# Patient Record
Sex: Male | Born: 1955 | Race: White | Hispanic: No | Marital: Married | Smoking: Current every day smoker
Health system: Southern US, Community
[De-identification: ages and names within clinical notes are randomized; demographics above are authoritative.]

## PROBLEM LIST (undated history)

## (undated) DIAGNOSIS — M549 Dorsalgia, unspecified: Secondary | ICD-10-CM

## (undated) DIAGNOSIS — G8929 Other chronic pain: Secondary | ICD-10-CM

## (undated) DIAGNOSIS — G894 Chronic pain syndrome: Secondary | ICD-10-CM

## (undated) DIAGNOSIS — B1920 Unspecified viral hepatitis C without hepatic coma: Secondary | ICD-10-CM

## (undated) HISTORY — DX: Other chronic pain: G89.29

## (undated) HISTORY — PX: OTHER SURGICAL HISTORY: SHX169

## (undated) HISTORY — DX: Unspecified viral hepatitis C without hepatic coma: B19.20

## (undated) HISTORY — PX: INGUINAL HERNIA REPAIR: SUR1180

## (undated) HISTORY — DX: Chronic pain syndrome: G89.4

## (undated) HISTORY — DX: Dorsalgia, unspecified: M54.9

---

## 2004-05-20 ENCOUNTER — Encounter: Admission: RE | Admit: 2004-05-20 | Discharge: 2004-05-20 | Payer: Self-pay | Admitting: Surgery

## 2004-05-21 ENCOUNTER — Ambulatory Visit (HOSPITAL_COMMUNITY): Admission: RE | Admit: 2004-05-21 | Discharge: 2004-05-21 | Payer: Self-pay | Admitting: Surgery

## 2004-05-21 ENCOUNTER — Ambulatory Visit (HOSPITAL_BASED_OUTPATIENT_CLINIC_OR_DEPARTMENT_OTHER): Admission: RE | Admit: 2004-05-21 | Discharge: 2004-05-21 | Payer: Self-pay | Admitting: Surgery

## 2012-01-22 ENCOUNTER — Emergency Department (HOSPITAL_COMMUNITY)
Admission: EM | Admit: 2012-01-22 | Discharge: 2012-01-23 | Disposition: A | Discharge status: Home / Self Care | Payer: BC Managed Care – PPO | Attending: Emergency Medicine | Admitting: Emergency Medicine

## 2012-01-22 ENCOUNTER — Encounter (HOSPITAL_COMMUNITY): Payer: Self-pay | Admitting: *Deleted

## 2012-01-22 DIAGNOSIS — IMO0002 Reserved for concepts with insufficient information to code with codable children: Secondary | ICD-10-CM | POA: Insufficient documentation

## 2012-01-22 DIAGNOSIS — S01511A Laceration without foreign body of lip, initial encounter: Secondary | ICD-10-CM

## 2012-01-22 DIAGNOSIS — R51 Headache: Secondary | ICD-10-CM | POA: Insufficient documentation

## 2012-01-22 DIAGNOSIS — S01501A Unspecified open wound of lip, initial encounter: Secondary | ICD-10-CM | POA: Insufficient documentation

## 2012-01-22 DIAGNOSIS — S0993XA Unspecified injury of face, initial encounter: Secondary | ICD-10-CM | POA: Insufficient documentation

## 2012-01-22 DIAGNOSIS — S025XXA Fracture of tooth (traumatic), initial encounter for closed fracture: Secondary | ICD-10-CM | POA: Insufficient documentation

## 2012-01-22 MED ORDER — OXYCODONE-ACETAMINOPHEN 5-325 MG PO TABS: 1.0000 | ORAL_TABLET | Freq: Four times a day (QID) | ORAL | Status: DC | PRN

## 2012-01-22 MED ORDER — OXYCODONE-ACETAMINOPHEN 5-325 MG PO TABS: 1.0000 | ORAL_TABLET | Freq: Four times a day (QID) | ORAL | Status: AC | PRN

## 2012-01-22 MED ORDER — HYDROMORPHONE HCL PF 1 MG/ML IJ SOLN
1.0000 mg | Freq: Once | INTRAMUSCULAR | Status: AC
  Administered 2012-01-22: 1 mg via INTRAMUSCULAR
  Filled 2012-01-22: qty 1

## 2012-01-22 MED ORDER — LIDOCAINE-EPINEPHRINE (PF) 1 %-1:200000 IJ SOLN
INTRAMUSCULAR | Status: AC
  Administered 2012-01-22: 22:00:00
  Filled 2012-01-22: qty 10

## 2012-01-22 MED ORDER — TETANUS-DIPHTH-ACELL PERTUSSIS 5-2.5-18.5 LF-MCG/0.5 IM SUSP
0.5000 mL | Freq: Once | INTRAMUSCULAR | Status: AC
  Administered 2012-01-22: 0.5 mL via INTRAMUSCULAR
  Filled 2012-01-22 (×2): qty 0.5

## 2012-01-22 MED ORDER — HYDROMORPHONE HCL PF 1 MG/ML IJ SOLN: 1.0000 mg | Freq: Once | INTRAMUSCULAR | Status: DC
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
## 2012-01-22 NOTE — ED Notes (Signed)
Left broken teeth at home

## 2012-01-22 NOTE — Discharge Instructions (Signed)
Follow up with a dentist Monday.  Your sutures should dissolve
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
## 2012-01-23 NOTE — ED Notes (Signed)
6 pack of Percocet dispensed as written by Dr Estell Harpin

## 2016-11-04 ENCOUNTER — Encounter (INDEPENDENT_AMBULATORY_CARE_PROVIDER_SITE_OTHER): Payer: Self-pay | Admitting: Internal Medicine

## 2016-11-04 ENCOUNTER — Encounter (INDEPENDENT_AMBULATORY_CARE_PROVIDER_SITE_OTHER): Payer: Self-pay

## 2016-11-25 ENCOUNTER — Encounter (INDEPENDENT_AMBULATORY_CARE_PROVIDER_SITE_OTHER): Payer: Self-pay | Admitting: Internal Medicine

## 2016-11-25 ENCOUNTER — Ambulatory Visit (INDEPENDENT_AMBULATORY_CARE_PROVIDER_SITE_OTHER): Payer: BC Managed Care – PPO | Admitting: Internal Medicine

## 2016-11-25 VITALS — BP 168/90 | HR 60 | Temp 97.6°F | Ht 72.0 in | Wt 233.6 lb

## 2016-11-25 DIAGNOSIS — G894 Chronic pain syndrome: Secondary | ICD-10-CM

## 2016-11-25 DIAGNOSIS — B182 Chronic viral hepatitis C: Secondary | ICD-10-CM

## 2016-11-25 DIAGNOSIS — M549 Dorsalgia, unspecified: Secondary | ICD-10-CM

## 2016-11-25 DIAGNOSIS — G8929 Other chronic pain: Secondary | ICD-10-CM

## 2016-11-25 DIAGNOSIS — B1920 Unspecified viral hepatitis C without hepatic coma: Secondary | ICD-10-CM

## 2016-11-25 HISTORY — DX: Chronic pain syndrome: G89.4

## 2016-11-25 HISTORY — DX: Other chronic pain: G89.29

## 2016-11-25 HISTORY — DX: Unspecified viral hepatitis C without hepatic coma: B19.20

## 2016-11-25 LAB — CBC WITH DIFFERENTIAL/PLATELET
BASOS PCT: 0 %
Basophils Absolute: 0 cells/uL (ref 0–200)
EOS PCT: 3 %
Eosinophils Absolute: 225 cells/uL (ref 15–500)
HEMATOCRIT: 48.8 % (ref 38.5–50.0)
HEMOGLOBIN: 16.9 g/dL (ref 13.2–17.1)
LYMPHS ABS: 1950 {cells}/uL (ref 850–3900)
Lymphocytes Relative: 26 %
MCH: 30.7 pg (ref 27.0–33.0)
MCHC: 34.6 g/dL (ref 32.0–36.0)
MCV: 88.7 fL (ref 80.0–100.0)
MONO ABS: 900 {cells}/uL (ref 200–950)
MPV: 11.8 fL (ref 7.5–12.5)
Monocytes Relative: 12 %
NEUTROS ABS: 4425 {cells}/uL (ref 1500–7800)
Neutrophils Relative %: 59 %
Platelets: 295 10*3/uL (ref 140–400)
RBC: 5.5 MIL/uL (ref 4.20–5.80)
RDW: 13.3 % (ref 11.0–15.0)
WBC: 7.5 10*3/uL (ref 3.8–10.8)

## 2016-11-25 LAB — HEPATIC FUNCTION PANEL
ALBUMIN: 4.4 g/dL (ref 3.6–5.1)
ALT: 49 U/L — AB (ref 9–46)
AST: 29 U/L (ref 10–35)
Alkaline Phosphatase: 59 U/L (ref 40–115)
BILIRUBIN TOTAL: 0.7 mg/dL (ref 0.2–1.2)
Bilirubin, Direct: 0.1 mg/dL (ref <=0.2)
Indirect Bilirubin: 0.6 mg/dL (ref 0.2–1.2)
TOTAL PROTEIN: 7.9 g/dL (ref 6.1–8.1)

## 2016-11-25 NOTE — Progress Notes (Signed)
   Subjective:    Patient ID: Joe Caldwell, male    DOB: [birth year], 61 y.o.   MRN: [id number]  HPI Referred by Dr. Olena LeatherwoodHasanaj for Hepatitis C.  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 Has not done IV drugs since the 70s-early 80s. No tattoos. No blood transfusion. No etoh. Has not drank etoh 3-4 yrs. Appetite is good. No weight loss.  No abdominal pain. He has a BM daily. No melena or BRRB. Denies suicidal idealation    08/27/2016 He C antibody positive.  Review of Systems Past Medical History:  Diagnosis Date  . Chronic back pain 11/25/2016  . Chronic pain syndrome 11/25/2016  . Hepatitis C 11/25/2016    No past surgical history on file.  No Known Allergies  No current outpatient prescriptions on file prior to visit.   No current facility-administered medications on file prior to visit.    Current Outpatient Prescriptions  Medication Sig Dispense Refill  . cetirizine (ZYRTEC) 10 MG tablet Take 10 mg by mouth daily.    . cyclobenzaprine (FLEXERIL) 10 MG tablet Take 10 mg by mouth 3 (three) times daily.    . diazepam (VALIUM) 10 MG tablet Take 10 mg by mouth every 6 (six) hours as needed for anxiety.    . Multiple Vitamin (MULTIVITAMIN) tablet Take 1 tablet by mouth daily.    . Omega-3 Fatty Acids (FISH OIL) 1200 MG CPDR Take by mouth 2 (two) times daily.    Marland Kitchen. omeprazole (PRILOSEC) 20 MG capsule Take 20 mg by mouth daily.    . traMADol (ULTRAM) 50 MG tablet Take by mouth every 6 (six) hours as needed.     No current facility-administered medications for this visit.        Objective:   Physical Exam Blood pressure (!) 168/90, pulse 60, temperature 97.6 F (36.4 C), height 6' (1.829 m), weight 233 lb 9.6 oz (106 kg).  Alert and oriented. Skin warm and dry. Oral mucosa is moist.   . Sclera anicteric, conjunctivae is pink. Thyroid not enlarged. No cervical lymphadenopathy. Lungs clear. Heart regular rate and rhythm.  Abdomen is soft. Bowel sounds are positive. No hepatomegaly. No  abdominal masses felt. No tenderness.  No edema to lower extremities.         Assessment & Plan:  Hepatitis C. Hepatic function, CBC, PT/INR, Hep C antibody, Hep C quaint, Hep C genotype, urine drug screen.  Once labs are back: US elastrography Patient signed treatment agreement OV in 3 months.

## 2016-11-26 ENCOUNTER — Encounter (INDEPENDENT_AMBULATORY_CARE_PROVIDER_SITE_OTHER): Payer: Self-pay

## 2016-11-26 LAB — HEPATITIS PANEL, ACUTE
HCV Ab: REACTIVE — AB
HEP A IGM: NONREACTIVE
Hep B C IgM: NONREACTIVE
Hepatitis B Surface Ag: NEGATIVE

## 2016-11-26 LAB — PROTIME-INR
INR: 1
PROTHROMBIN TIME: 10.4 s (ref 9.0–11.5)

## 2016-11-26 LAB — AFP TUMOR MARKER: AFP TUMOR MARKER: 2.4 ng/mL (ref <6.1)

## 2016-11-28 LAB — DRUG ABUSE 10-50+ETHANOL, U
ALCOHOL, ETHYL (U): NEGATIVE
AMPHETAMINES (1000 ng/mL SCRN): NEGATIVE
BARBITURATES: NEGATIVE
BENZODIAZEPINES: POSITIVE — AB
COCAINE METABOLITES: NEGATIVE
MARIJUANA MET (50 NG/ML SCRN): NEGATIVE
METHADONE: NEGATIVE
METHAQUALONE: NEGATIVE
OPIATES: NEGATIVE
PHENCYCLIDINE: NEGATIVE
PROPOXYPHENE: NEGATIVE

## 2016-11-30 LAB — PAIN MGMT, PROFILE 1 W/O CONF, U
Amphetamines: NEGATIVE ng/mL (ref <500)
BARBITURATES: NEGATIVE ng/mL (ref <300)
Benzodiazepines: POSITIVE ng/mL — AB (ref <100)
COCAINE METABOLITE: NEGATIVE ng/mL (ref <150)
Creatinine: 55.4 mg/dL (ref >=20.0)
MARIJUANA METABOLITE: NEGATIVE ng/mL (ref <20)
METHADONE METABOLITE: NEGATIVE ng/mL (ref <100)
OPIATES: NEGATIVE ng/mL (ref <100)
OXYCODONE: NEGATIVE ng/mL (ref <100)
Oxidant: NEGATIVE ug/mL (ref <200)
PHENCYCLIDINE: NEGATIVE ng/mL (ref <25)
pH: 5.14 (ref 4.5–9.0)

## 2016-12-01 ENCOUNTER — Telehealth (INDEPENDENT_AMBULATORY_CARE_PROVIDER_SITE_OTHER): Payer: Self-pay | Admitting: *Deleted

## 2016-12-01 NOTE — Telephone Encounter (Signed)
Cohen from [facility] returned a call regarding the Hepatitis C testing results. He says that there may be an additional 5 days before these test result. His call back number is [phone].

## 2016-12-03 LAB — HEPATITIS C GENOTYPE

## 2016-12-06 ENCOUNTER — Telehealth (INDEPENDENT_AMBULATORY_CARE_PROVIDER_SITE_OTHER): Payer: Self-pay | Admitting: Internal Medicine

## 2016-12-06 DIAGNOSIS — B1920 Unspecified viral hepatitis C without hepatic coma: Secondary | ICD-10-CM

## 2016-12-06 LAB — HEPATITIS C RNA QUANTITATIVE
HCV Quantitative Log: 7.11 {Log} — ABNORMAL HIGH (ref <1.18)
HCV Quantitative: 12779614 IU/mL — ABNORMAL HIGH (ref <15)

## 2016-12-06 NOTE — Telephone Encounter (Signed)
Joe Caldwell, US abdomen/elast. I spoke with patient today

## 2016-12-06 NOTE — Telephone Encounter (Signed)
US sch'd 12/09/16 at 800 am (75), npo after midnight, patient aware

## 2016-12-09 ENCOUNTER — Ambulatory Visit (HOSPITAL_COMMUNITY): Admission: RE | Admit: 2016-12-09 | Payer: BC Managed Care – PPO | Source: Ambulatory Visit

## 2016-12-14 ENCOUNTER — Ambulatory Visit (HOSPITAL_COMMUNITY)
Admission: RE | Admit: 2016-12-14 | Discharge: 2016-12-14 | Disposition: A | Discharge status: Home / Self Care | Payer: BC Managed Care – PPO | Source: Ambulatory Visit | Attending: Internal Medicine | Admitting: Internal Medicine

## 2016-12-14 DIAGNOSIS — B1920 Unspecified viral hepatitis C without hepatic coma: Secondary | ICD-10-CM | POA: Diagnosis present

## 2016-12-14 DIAGNOSIS — R935 Abnormal findings on diagnostic imaging of other abdominal regions, including retroperitoneum: Secondary | ICD-10-CM | POA: Diagnosis not present

## 2016-12-14 DIAGNOSIS — R140 Abdominal distension (gaseous): Secondary | ICD-10-CM | POA: Diagnosis not present

## 2016-12-14 DIAGNOSIS — R932 Abnormal findings on diagnostic imaging of liver and biliary tract: Secondary | ICD-10-CM | POA: Insufficient documentation

## 2016-12-17 ENCOUNTER — Telehealth (INDEPENDENT_AMBULATORY_CARE_PROVIDER_SITE_OTHER): Payer: Self-pay | Admitting: Internal Medicine

## 2016-12-17 NOTE — Telephone Encounter (Signed)
Will treat with Harvoni x 12 weeks.  US elastrography: Cirrhosis is not present.

## 2016-12-20 NOTE — Telephone Encounter (Signed)
This will be worked on 12/22/2016.

## 2016-12-21 NOTE — Telephone Encounter (Signed)
We have prepared the paperwork to be sent to [employer] for assistance with PA. Patient will be notified as we are made aware of the status.

## 2016-12-28 ENCOUNTER — Telehealth (INDEPENDENT_AMBULATORY_CARE_PROVIDER_SITE_OTHER): Payer: Self-pay | Admitting: Internal Medicine

## 2016-12-28 NOTE — Telephone Encounter (Signed)
Patient called, stated that he got a letter about the Harvoni.  Stated that the [employer] company is saying that whoever called from our office hung up on them and they need to speak to our office to get approval done.  [phone]

## 2016-12-29 NOTE — Telephone Encounter (Signed)
Patient was called and made aware that [facility] will be handling his medication. Once it has been rec'd to our office , we will call him to come and pick it up.

## 2016-12-29 NOTE — Telephone Encounter (Signed)
This medication has been approved, [facility] will need to fill this Rx due to the patient's insurance. This has been verified with [facility]. They will contact the patient . Medication will be shipped to our office.

## 2017-01-03 ENCOUNTER — Telehealth (INDEPENDENT_AMBULATORY_CARE_PROVIDER_SITE_OTHER): Payer: Self-pay | Admitting: Internal Medicine

## 2017-01-03 ENCOUNTER — Other Ambulatory Visit (INDEPENDENT_AMBULATORY_CARE_PROVIDER_SITE_OTHER): Payer: Self-pay | Admitting: *Deleted

## 2017-01-03 DIAGNOSIS — B182 Chronic viral hepatitis C: Secondary | ICD-10-CM

## 2017-01-03 NOTE — Telephone Encounter (Signed)
Tammy, Hepatic, CBC and Hep C quaint in 8 weeks.  Hope, OV in 8 weeks

## 2017-01-03 NOTE — Telephone Encounter (Signed)
CBC, Hepatic, Hep C quaint in 4 weeks. sorry

## 2017-01-03 NOTE — Telephone Encounter (Signed)
Labs are noted for 4 weeks , and a letter will be sent as a reminder.   

## 2017-01-03 NOTE — Telephone Encounter (Signed)
Labs are noted for 4 weeks , and a letter will be sent as a reminder.

## 2017-01-06 NOTE — Telephone Encounter (Signed)
error 

## 2017-01-11 ENCOUNTER — Encounter (INDEPENDENT_AMBULATORY_CARE_PROVIDER_SITE_OTHER): Payer: Self-pay | Admitting: Internal Medicine

## 2017-01-11 NOTE — Telephone Encounter (Signed)
Patient was given an appointment for 02/28/17 at 10:45am.  I called the patient and a letter was mailed.

## 2017-01-17 ENCOUNTER — Other Ambulatory Visit (INDEPENDENT_AMBULATORY_CARE_PROVIDER_SITE_OTHER): Payer: Self-pay | Admitting: *Deleted

## 2017-01-17 ENCOUNTER — Encounter (INDEPENDENT_AMBULATORY_CARE_PROVIDER_SITE_OTHER): Payer: Self-pay | Admitting: *Deleted

## 2017-01-17 DIAGNOSIS — B182 Chronic viral hepatitis C: Secondary | ICD-10-CM

## 2017-01-27 LAB — HEPATIC FUNCTION PANEL
ALBUMIN: 4.6 g/dL (ref 3.6–5.1)
ALK PHOS: 66 U/L (ref 40–115)
ALT: 36 U/L (ref 9–46)
AST: 22 U/L (ref 10–35)
BILIRUBIN INDIRECT: 0.5 mg/dL (ref 0.2–1.2)
BILIRUBIN TOTAL: 0.6 mg/dL (ref 0.2–1.2)
Bilirubin, Direct: 0.1 mg/dL (ref <=0.2)
Total Protein: 8.2 g/dL — ABNORMAL HIGH (ref 6.1–8.1)

## 2017-01-27 LAB — CBC
HEMATOCRIT: 49.4 % (ref 38.5–50.0)
HEMOGLOBIN: 17 g/dL (ref 13.2–17.1)
MCH: 31.1 pg (ref 27.0–33.0)
MCHC: 34.4 g/dL (ref 32.0–36.0)
MCV: 90.5 fL (ref 80.0–100.0)
MPV: 11.8 fL (ref 7.5–12.5)
Platelets: 296 10*3/uL (ref 140–400)
RBC: 5.46 MIL/uL (ref 4.20–5.80)
RDW: 13.3 % (ref 11.0–15.0)
WBC: 7.7 10*3/uL (ref 3.8–10.8)

## 2017-01-29 LAB — HEPATITIS C RNA QUANTITATIVE
HCV QUANT LOG: DETECTED {Log_IU}/mL — AB
HCV QUANT: DETECTED [IU]/mL — AB

## 2017-01-31 ENCOUNTER — Other Ambulatory Visit (INDEPENDENT_AMBULATORY_CARE_PROVIDER_SITE_OTHER): Payer: Self-pay | Admitting: *Deleted

## 2017-01-31 DIAGNOSIS — B182 Chronic viral hepatitis C: Secondary | ICD-10-CM

## 2017-02-14 ENCOUNTER — Encounter (INDEPENDENT_AMBULATORY_CARE_PROVIDER_SITE_OTHER): Payer: Self-pay | Admitting: *Deleted

## 2017-02-14 ENCOUNTER — Other Ambulatory Visit (INDEPENDENT_AMBULATORY_CARE_PROVIDER_SITE_OTHER): Payer: Self-pay | Admitting: *Deleted

## 2017-02-14 DIAGNOSIS — B182 Chronic viral hepatitis C: Secondary | ICD-10-CM

## 2017-02-17 ENCOUNTER — Other Ambulatory Visit (INDEPENDENT_AMBULATORY_CARE_PROVIDER_SITE_OTHER): Payer: Self-pay | Admitting: *Deleted

## 2017-02-17 ENCOUNTER — Encounter (INDEPENDENT_AMBULATORY_CARE_PROVIDER_SITE_OTHER): Payer: Self-pay | Admitting: *Deleted

## 2017-02-17 ENCOUNTER — Telehealth (INDEPENDENT_AMBULATORY_CARE_PROVIDER_SITE_OTHER): Payer: Self-pay | Admitting: Internal Medicine

## 2017-02-17 DIAGNOSIS — B182 Chronic viral hepatitis C: Secondary | ICD-10-CM

## 2017-02-17 NOTE — Telephone Encounter (Signed)
Joe Caldwell, CBC, Hepatic andHep C quaint in 4 weeks.  

## 2017-02-17 NOTE — Telephone Encounter (Signed)
Patient has an appointment 04/14/17.  Terri would like labs in four weeks.

## 2017-02-17 NOTE — Telephone Encounter (Signed)
Labs are noted and a letter has been sent as a reminder.

## 2017-02-17 NOTE — Telephone Encounter (Signed)
Labs are noted 

## 2017-02-23 ENCOUNTER — Ambulatory Visit (INDEPENDENT_AMBULATORY_CARE_PROVIDER_SITE_OTHER): Payer: BC Managed Care – PPO | Admitting: Internal Medicine

## 2017-02-28 ENCOUNTER — Ambulatory Visit (INDEPENDENT_AMBULATORY_CARE_PROVIDER_SITE_OTHER): Payer: BC Managed Care – PPO | Admitting: Internal Medicine

## 2017-02-28 LAB — CBC
HEMATOCRIT: 49.6 % (ref 38.5–50.0)
HEMOGLOBIN: 17 g/dL (ref 13.2–17.1)
MCH: 30.7 pg (ref 27.0–33.0)
MCHC: 34.3 g/dL (ref 32.0–36.0)
MCV: 89.7 fL (ref 80.0–100.0)
MPV: 11.7 fL (ref 7.5–12.5)
Platelets: 290 10*3/uL (ref 140–400)
RBC: 5.53 MIL/uL (ref 4.20–5.80)
RDW: 13.1 % (ref 11.0–15.0)
WBC: 6.7 10*3/uL (ref 3.8–10.8)

## 2017-02-28 LAB — HEPATIC FUNCTION PANEL
ALT: 48 U/L — AB (ref 9–46)
AST: 27 U/L (ref 10–35)
Albumin: 4.4 g/dL (ref 3.6–5.1)
Alkaline Phosphatase: 60 U/L (ref 40–115)
BILIRUBIN DIRECT: 0.1 mg/dL (ref <=0.2)
BILIRUBIN INDIRECT: 0.5 mg/dL (ref 0.2–1.2)
Total Bilirubin: 0.6 mg/dL (ref 0.2–1.2)
Total Protein: 7.9 g/dL (ref 6.1–8.1)

## 2017-03-02 LAB — HEPATITIS C RNA QUANTITATIVE
HCV Quantitative Log: <1.18 Log IU/mL
HCV Quantitative: <15 IU/mL

## 2017-04-14 ENCOUNTER — Ambulatory Visit (INDEPENDENT_AMBULATORY_CARE_PROVIDER_SITE_OTHER): Payer: BC Managed Care – PPO | Admitting: Internal Medicine

## 2017-04-14 ENCOUNTER — Encounter (INDEPENDENT_AMBULATORY_CARE_PROVIDER_SITE_OTHER): Payer: Self-pay | Admitting: Internal Medicine

## 2017-04-14 ENCOUNTER — Encounter (INDEPENDENT_AMBULATORY_CARE_PROVIDER_SITE_OTHER): Payer: Self-pay

## 2017-04-14 VITALS — BP 142/90 | HR 80 | Temp 98.0°F | Ht 73.0 in | Wt 233.0 lb

## 2017-04-14 DIAGNOSIS — B182 Chronic viral hepatitis C: Secondary | ICD-10-CM

## 2017-04-14 LAB — HEPATIC FUNCTION PANEL
ALT: 50 U/L — ABNORMAL HIGH (ref 9–46)
AST: 27 U/L (ref 10–35)
Albumin: 4.3 g/dL (ref 3.6–5.1)
Alkaline Phosphatase: 70 U/L (ref 40–115)
BILIRUBIN DIRECT: 0.1 mg/dL (ref <=0.2)
BILIRUBIN INDIRECT: 0.5 mg/dL (ref 0.2–1.2)
BILIRUBIN TOTAL: 0.6 mg/dL (ref 0.2–1.2)
Total Protein: 7.7 g/dL (ref 6.1–8.1)

## 2017-04-14 NOTE — Progress Notes (Signed)
   Subjective:    Patient ID: Joe Caldwell, male    DOB: 1956/07/11, 61 y.o.   MRN: [id number]  HPI Here today for f/u.  Hx of Hepatitis C.  Genotype 1A.  Covered with Harvoni.  12/14/2016 [place] Elast F2-F3.  02/28/2017 Hep C quaint not detected.  Appetite is good. No weight loss. BMs daily. No melena or BRRB He states he feels good.  No GI complaints.   Hepatic Function Latest Ref Rng & Units 02/28/2017 01/27/2017 11/25/2016  Total Protein 6.1 - 8.1 g/dL 7.9 8.2(H) 7.9  Albumin 3.6 - 5.1 g/dL 4.4 4.6 4.4  AST 10 - 35 U/L '27 22 29  '$ ALT 9 - 46 U/L 48(H) 36 49(H)  Alk Phosphatase 40 - 115 U/L 60 66 59  Total Bilirubin 0.2 - 1.2 mg/dL 0.6 0.6 0.7  Bilirubin, Direct <=0.2 mg/dL 0.1 0.1 0.1       Review of Systems     Current Outpatient Prescriptions  Medication Sig Dispense Refill  . cetirizine (ZYRTEC) 10 MG tablet Take 10 mg by mouth daily.    . cyclobenzaprine (FLEXERIL) 10 MG tablet Take 10 mg by mouth 3 (three) times daily.    . diazepam (VALIUM) 10 MG tablet Take 10 mg by mouth every 6 (six) hours as needed for anxiety.    . Multiple Vitamin (MULTIVITAMIN) tablet Take 1 tablet by mouth daily.    . Omega-3 Fatty Acids (FISH OIL) 1200 MG CPDR Take by mouth 2 (two) times daily.    Marland Kitchen omeprazole (PRILOSEC) 20 MG capsule Take 20 mg by mouth daily.    . traMADol (ULTRAM) 50 MG tablet Take by mouth every 6 (six) hours as needed.     No current facility-administered medications for this visit.         Objective:   Physical Exam Blood pressure (!) 142/90, pulse 80, temperature 98 F (36.7 C), height '6\' 1"'$  (1.854 m), weight 233 lb (105.7 kg).  Alert and oriented. Skin warm and dry. Oral mucosa is moist.   . Sclera anicteric, conjunctivae is pink. Thyroid not enlarged. No cervical lymphadenopathy. Lungs clear. Heart regular rate and rhythm.  Abdomen is soft. Bowel sounds are positive. No hepatomegaly. No abdominal masses felt. No tenderness.  No edema to lower extremities.          Assessment & Plan:  Hepatitis C. Doing well. Will see back in 1 year. Will get Hep C quaint.

## 2017-04-14 NOTE — Patient Instructions (Signed)
OV in 1 year.  

## 2017-04-20 LAB — HEPATITIS C RNA QUANTITATIVE
HCV Quantitative Log: <1.18 Log IU/mL
HCV Quantitative: <15 IU/mL

## 2017-05-26 ENCOUNTER — Encounter (INDEPENDENT_AMBULATORY_CARE_PROVIDER_SITE_OTHER): Payer: Self-pay | Admitting: *Deleted

## 2017-06-06 ENCOUNTER — Telehealth (INDEPENDENT_AMBULATORY_CARE_PROVIDER_SITE_OTHER): Payer: Self-pay | Admitting: *Deleted

## 2017-06-06 ENCOUNTER — Other Ambulatory Visit (INDEPENDENT_AMBULATORY_CARE_PROVIDER_SITE_OTHER): Payer: Self-pay | Admitting: Internal Medicine

## 2017-06-06 DIAGNOSIS — B182 Chronic viral hepatitis C: Secondary | ICD-10-CM

## 2017-06-06 NOTE — Telephone Encounter (Signed)
Patient on recall for 6 mth US, need order please

## 2017-06-06 NOTE — Telephone Encounter (Signed)
I have ordered.

## 2017-06-07 NOTE — Telephone Encounter (Signed)
US sch'd 06/10/17 at 930 (915), npo after midnight, patient aware

## 2017-06-10 ENCOUNTER — Ambulatory Visit (HOSPITAL_COMMUNITY)
Admission: RE | Admit: 2017-06-10 | Discharge: 2017-06-10 | Disposition: A | Discharge status: Home / Self Care | Payer: BC Managed Care – PPO | Source: Ambulatory Visit | Attending: Internal Medicine | Admitting: Internal Medicine

## 2017-06-10 DIAGNOSIS — K7689 Other specified diseases of liver: Secondary | ICD-10-CM | POA: Insufficient documentation

## 2017-06-10 DIAGNOSIS — B182 Chronic viral hepatitis C: Secondary | ICD-10-CM | POA: Insufficient documentation

## 2017-06-13 ENCOUNTER — Telehealth (INDEPENDENT_AMBULATORY_CARE_PROVIDER_SITE_OTHER): Payer: Self-pay | Admitting: Internal Medicine

## 2017-06-13 DIAGNOSIS — K7689 Other specified diseases of liver: Secondary | ICD-10-CM

## 2017-06-13 NOTE — Telephone Encounter (Signed)
MRI sch'd 06/17/17, patient aware

## 2017-06-13 NOTE — Telephone Encounter (Signed)
MRI ordered

## 2017-06-13 NOTE — Telephone Encounter (Signed)
err

## 2017-06-13 NOTE — Telephone Encounter (Signed)
Ann, MRI abdomen w/wo com

## 2017-06-17 ENCOUNTER — Ambulatory Visit (HOSPITAL_COMMUNITY)
Admission: RE | Admit: 2017-06-17 | Discharge: 2017-06-17 | Disposition: A | Discharge status: Home / Self Care | Payer: BC Managed Care – PPO | Source: Ambulatory Visit | Attending: Internal Medicine | Admitting: Internal Medicine

## 2017-06-17 DIAGNOSIS — K7689 Other specified diseases of liver: Secondary | ICD-10-CM | POA: Insufficient documentation

## 2017-06-17 DIAGNOSIS — K760 Fatty (change of) liver, not elsewhere classified: Secondary | ICD-10-CM | POA: Diagnosis not present

## 2017-06-17 MED ORDER — GADOBENATE DIMEGLUMINE 529 MG/ML IV SOLN
20.0000 mL | Freq: Once | INTRAVENOUS | Status: AC | PRN
  Administered 2017-06-17: 20 mL via INTRAVENOUS

## 2017-11-18 ENCOUNTER — Encounter (INDEPENDENT_AMBULATORY_CARE_PROVIDER_SITE_OTHER): Payer: Self-pay | Admitting: *Deleted

## 2018-02-21 ENCOUNTER — Telehealth (INDEPENDENT_AMBULATORY_CARE_PROVIDER_SITE_OTHER): Payer: Self-pay | Admitting: Internal Medicine

## 2018-02-21 NOTE — Telephone Encounter (Signed)
Correction: MRI abdomen w/o for liver lesions.

## 2018-02-21 NOTE — Telephone Encounter (Signed)
Needs MRI abdomen/pelvis w/o Contrast for liver lesions. Was on a 6 months recall. Thanks.

## 2018-03-01 NOTE — Telephone Encounter (Signed)
If I need to schedule this please put order in or are you telling me to do it on recall

## 2018-03-02 ENCOUNTER — Telehealth (INDEPENDENT_AMBULATORY_CARE_PROVIDER_SITE_OTHER): Payer: Self-pay | Admitting: Internal Medicine

## 2018-03-02 DIAGNOSIS — K769 Liver disease, unspecified: Secondary | ICD-10-CM

## 2018-03-02 NOTE — Telephone Encounter (Signed)
They are faxing 

## 2018-03-02 NOTE — Telephone Encounter (Signed)
Joe Caldwell, MRI ordered

## 2018-03-02 NOTE — Telephone Encounter (Signed)
Please get recent labs from Dr. Polly CobiaHasanji

## 2018-03-02 NOTE — Telephone Encounter (Signed)
MRI ordered

## 2018-03-03 NOTE — Telephone Encounter (Signed)
Per Dr Karilyn Cotaehman until July for MRI, which will be a year from last one, patient on recall for July

## 2018-03-03 NOTE — Telephone Encounter (Signed)
Per Dr Rehman until July for MRI, which will be a year from last one, patient on recall for July 

## 2018-04-18 ENCOUNTER — Ambulatory Visit (INDEPENDENT_AMBULATORY_CARE_PROVIDER_SITE_OTHER): Payer: BC Managed Care – PPO | Admitting: Internal Medicine

## 2018-04-18 ENCOUNTER — Encounter (INDEPENDENT_AMBULATORY_CARE_PROVIDER_SITE_OTHER): Payer: Self-pay | Admitting: Internal Medicine

## 2018-04-18 VITALS — BP 130/90 | HR 68 | Temp 98.2°F | Ht 72.0 in | Wt 237.0 lb

## 2018-04-18 DIAGNOSIS — B182 Chronic viral hepatitis C: Secondary | ICD-10-CM

## 2018-04-18 DIAGNOSIS — K769 Liver disease, unspecified: Secondary | ICD-10-CM

## 2018-04-18 NOTE — Patient Instructions (Signed)
OV in 1 year.  

## 2018-04-18 NOTE — Progress Notes (Signed)
Subjective:    Patient ID: Joe Caldwell, male    DOB: [birth year], 62 y.o.   MRN: [id number]  HPI Here today for f/u. Last seen in May of 2018. Hx of Hepatitis C.  Treated with Havoni.  Geno type 1A.  04/18/2017 Hep C quaint undetected.  He is doing good. No GI complaints. Appetite is good. No weight loss. Has a BM x 1 a day.  No etoh   CBC    Component Value Date/Time   WBC 6.7 02/28/2017 0903   RBC 5.53 02/28/2017 0903   HGB 17.0 02/28/2017 0903   HCT 49.6 02/28/2017 0903   PLT 290 02/28/2017 0903   MCV 89.7 02/28/2017 0903   MCH 30.7 02/28/2017 0903   MCHC 34.3 02/28/2017 0903   RDW 13.1 02/28/2017 0903   LYMPHSABS 1,950 11/25/2016 1034   MONOABS 900 11/25/2016 1034   EOSABS 225 11/25/2016 1034   BASOSABS 0 11/25/2016 1034   Hepatic Function Latest Ref Rng & Units 04/14/2017 02/28/2017 01/27/2017  Total Protein 6.1 - 8.1 g/dL 7.7 7.9 8.2(H)  Albumin 3.6 - 5.1 g/dL 4.3 4.4 4.6  AST 10 - 35 U/L 27 27 22   ALT 9 - 46 U/L 50(H) 48(H) 36  Alk Phosphatase 40 - 115 U/L 70 60 66  Total Bilirubin 0.2 - 1.2 mg/dL 0.6 0.6 0.6  Bilirubin, Direct <=0.2 mg/dL 0.1 0.1 0.1     06/18/2017 MR abdomen w wo CM: Hepatic lesions.  IMPRESSION: Diffuse hepatic steatosis and multiple small hepatic cysts, which correspond with lesions seen on recent ultrasound.  Review of Systems Past Medical History:  Diagnosis Date  . Chronic back pain 11/25/2016  . Chronic pain syndrome 11/25/2016  . Hepatitis C 11/25/2016    Past Surgical History:  Procedure Laterality Date  . fatty tumor removed from left mid abdomen    . INGUINAL HERNIA REPAIR    . umblical hernia repair      No Known Allergies  Current Outpatient Medications on File Prior to Visit  Medication Sig Dispense Refill  . cetirizine (ZYRTEC) 10 MG tablet Take 10 mg by mouth daily.    . cyclobenzaprine (FLEXERIL) 10 MG tablet Take 10 mg by mouth 3 (three) times daily.    . diazepam (VALIUM) 10 MG tablet Take 10 mg by mouth every 6  (six) hours as needed for anxiety.    . Multiple Vitamin (MULTIVITAMIN) tablet Take 1 tablet by mouth daily.    . Omega-3 Fatty Acids (FISH OIL) 1200 MG CPDR Take by mouth 2 (two) times daily.    Marland Kitchen omeprazole (PRILOSEC) 20 MG capsule Take 20 mg by mouth daily.    . traMADol (ULTRAM) 50 MG tablet Take by mouth every 6 (six) hours as needed.     No current facility-administered medications on file prior to visit.         Objective:   Physical Exam Blood pressure 130/90, pulse 68, temperature 98.2 F (36.8 C), height 6' (1.829 m), weight 237 lb (107.5 kg).  Alert and oriented. Skin warm and dry. Oral mucosa is moist.   . Sclera anicteric, conjunctivae is pink. Thyroid not enlarged. No cervical lymphadenopathy. Lungs clear. Heart regular rate and rhythm.  Abdomen is soft. Bowel sounds are positive. No hepatomegaly. No abdominal masses felt. No tenderness.  No edema to lower extremities.        Assessment & Plan:  Hepatitis C. He remains in remission. Will check Hep C quaint and Hepatic function. MRI abdomen w/wo  CM: Liver lesion

## 2018-04-20 LAB — CBC WITH DIFFERENTIAL/PLATELET
BASOS PCT: 0.4 %
Basophils Absolute: 32 cells/uL (ref 0–200)
EOS PCT: 2.5 %
Eosinophils Absolute: 200 cells/uL (ref 15–500)
HCT: 48.7 % (ref 38.5–50.0)
Hemoglobin: 17.1 g/dL (ref 13.2–17.1)
Lymphs Abs: 2032 cells/uL (ref 850–3900)
MCH: 30.3 pg (ref 27.0–33.0)
MCHC: 35.1 g/dL (ref 32.0–36.0)
MCV: 86.2 fL (ref 80.0–100.0)
MPV: 11.6 fL (ref 7.5–12.5)
Monocytes Relative: 11.3 %
Neutro Abs: 4832 cells/uL (ref 1500–7800)
Neutrophils Relative %: 60.4 %
PLATELETS: 297 10*3/uL (ref 140–400)
RBC: 5.65 10*6/uL (ref 4.20–5.80)
RDW: 12.8 % (ref 11.0–15.0)
TOTAL LYMPHOCYTE: 25.4 %
WBC: 8 10*3/uL (ref 3.8–10.8)
WBCMIX: 904 {cells}/uL (ref 200–950)

## 2018-04-20 LAB — HEPATIC FUNCTION PANEL
AG Ratio: 1.4 (calc) (ref 1.0–2.5)
ALBUMIN MSPROF: 4.8 g/dL (ref 3.6–5.1)
ALT: 36 U/L (ref 9–46)
AST: 24 U/L (ref 10–35)
Alkaline phosphatase (APISO): 95 U/L (ref 40–115)
BILIRUBIN DIRECT: 0.1 mg/dL (ref 0.0–0.2)
GLOBULIN: 3.5 g/dL (ref 1.9–3.7)
Indirect Bilirubin: 0.5 mg/dL (calc) (ref 0.2–1.2)
TOTAL PROTEIN: 8.3 g/dL — AB (ref 6.1–8.1)
Total Bilirubin: 0.6 mg/dL (ref 0.2–1.2)

## 2018-04-20 LAB — HEPATITIS C RNA QUANTITATIVE
HCV Quantitative Log: <1.18 Log IU/mL
HCV RNA, PCR, QN: NOT DETECTED [IU]/mL

## 2018-04-24 ENCOUNTER — Ambulatory Visit (HOSPITAL_COMMUNITY)
Admission: RE | Admit: 2018-04-24 | Discharge: 2018-04-24 | Disposition: A | Discharge status: Home / Self Care | Payer: BC Managed Care – PPO | Source: Ambulatory Visit | Attending: Internal Medicine | Admitting: Internal Medicine

## 2018-04-24 DIAGNOSIS — K769 Liver disease, unspecified: Secondary | ICD-10-CM | POA: Insufficient documentation

## 2018-04-24 DIAGNOSIS — K760 Fatty (change of) liver, not elsewhere classified: Secondary | ICD-10-CM | POA: Insufficient documentation

## 2018-04-24 LAB — POCT I-STAT CREATININE: Creatinine, Ser: 0.9 mg/dL (ref 0.61–1.24)

## 2018-04-24 MED ORDER — GADOBENATE DIMEGLUMINE 529 MG/ML IV SOLN
20.0000 mL | Freq: Once | INTRAVENOUS | Status: AC | PRN
  Administered 2018-04-24: 20 mL via INTRAVENOUS

## 2018-06-11 IMAGING — MR MR ABDOMEN WO/W CM
10 of 18 series · 24 of 48 positions shown · IV contrast (multihance)
Comparison: 06/17/2017 MRI abdomen.

CLINICAL DATA: Hepatitis-C. Reported history of cirrhosis.
Follow-up subcentimeter liver lesions.

EXAM:
MRI ABDOMEN WITHOUT AND WITH CONTRAST
TECHNIQUE: Multiplanar multisequence MR imaging of the abdomen was performed
both before and after the administration of intravenous contrast.
CONTRAST:  20mL MULTIHANCE GADOBENATE DIMEGLUMINE 529 MG/ML IV SOLN

[Series 3: T2 · coronal · 5.0mm · 1.41mm/px · 1 of 45 slices shown (1 of 2)]
[im 1/45]
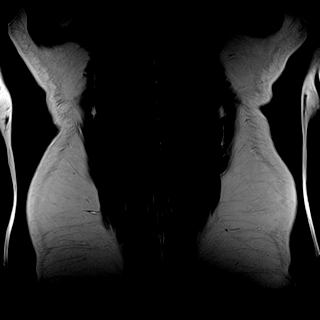

[Series 4: t2fs axial blade · axial · 4.0mm · 1.26mm/px · 1 of 55 slices shown]
[im 1/55]
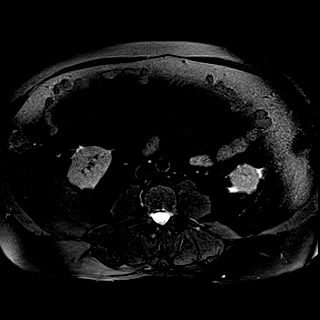

[Series 5: DWI · axial · 5.0mm · 0.97mm/px · z∈[-159,+75]mm · 2 of 80 slices shown (1 of 2)]
[im 1/80]
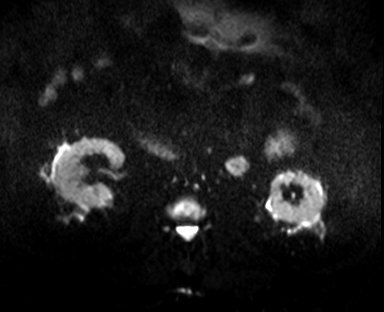
[im 80/80]
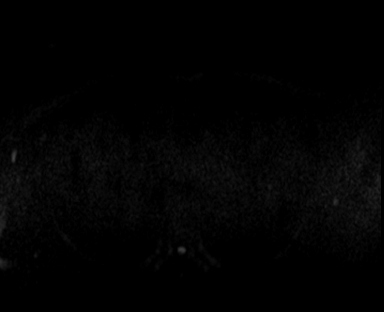

[Series 6: DWI · axial · 5.0mm · 0.97mm/px · z∈[-159,+75]mm · 2 of 40 slices shown (2 of 2)]
[im 1/40]
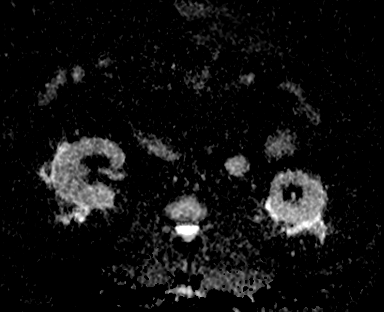
[im 40/40]
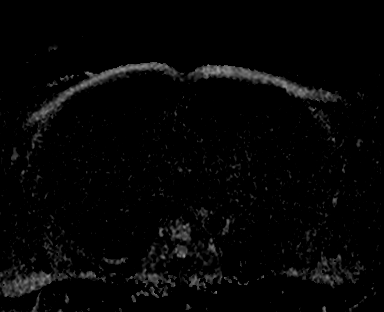

[Series 7: bSSFP · axial · 4.0mm · 0.78mm/px · z∈[-180,+96]mm · 3 of 70 slices shown]
[im 1/70]
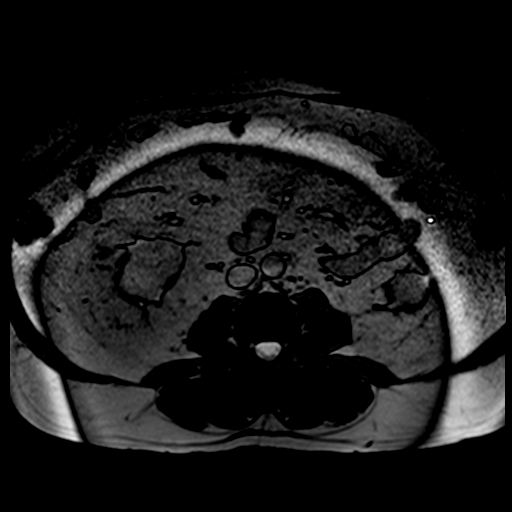
[im 35/70]
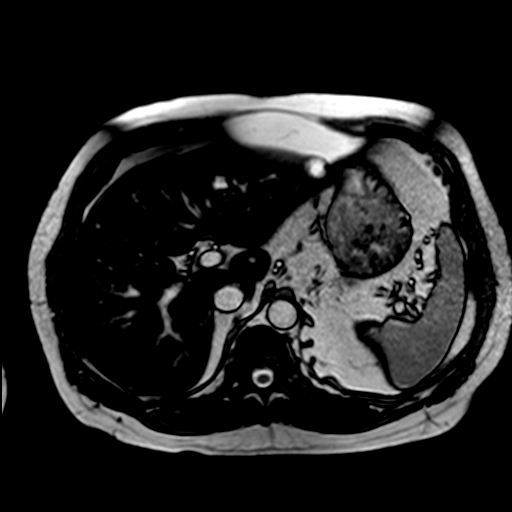
[im 70/70]
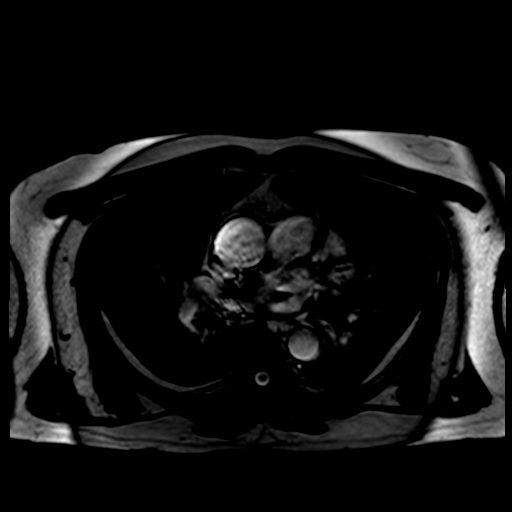

[Series 8: T1 · axial · 4.0mm · 0.62mm/px · z∈[-160,+92]mm · 3 of 64 slices shown (1 of 2)]
[im 1/64]
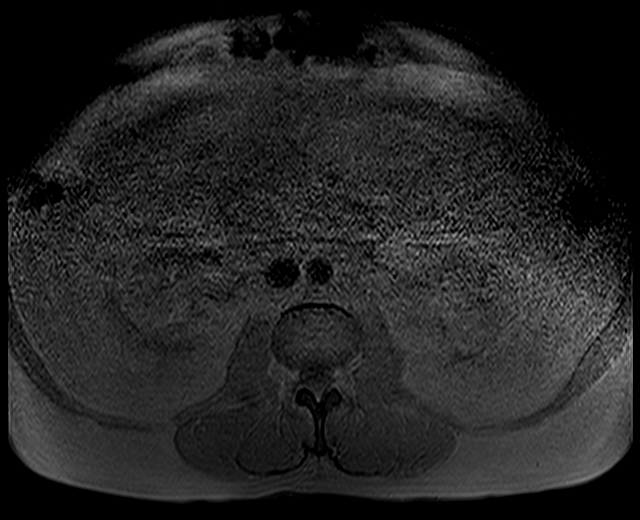
[im 32/64]
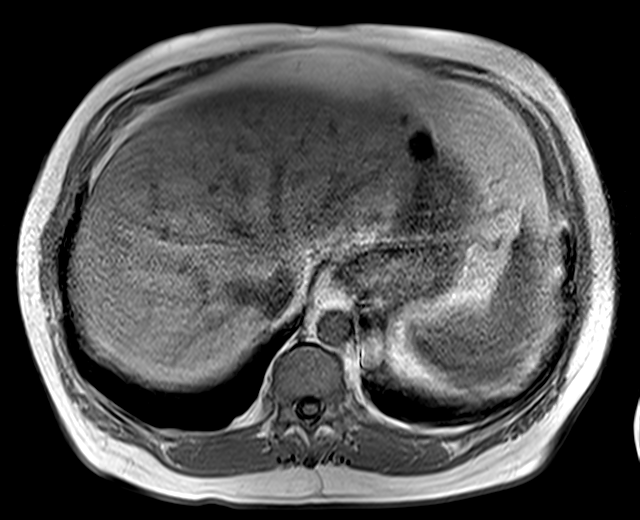
[im 64/64]
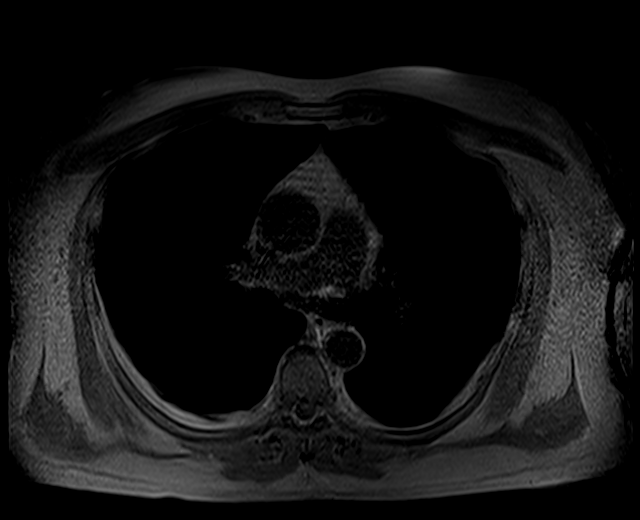

[Series 9: T1 · axial · 4.0mm · 0.62mm/px · z∈[-160,+92]mm · 3 of 64 slices shown (2 of 2)]
[im 1/64]
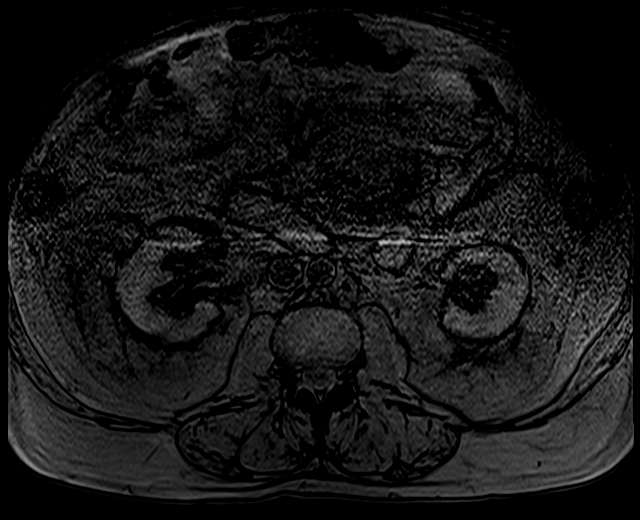
[im 32/64]
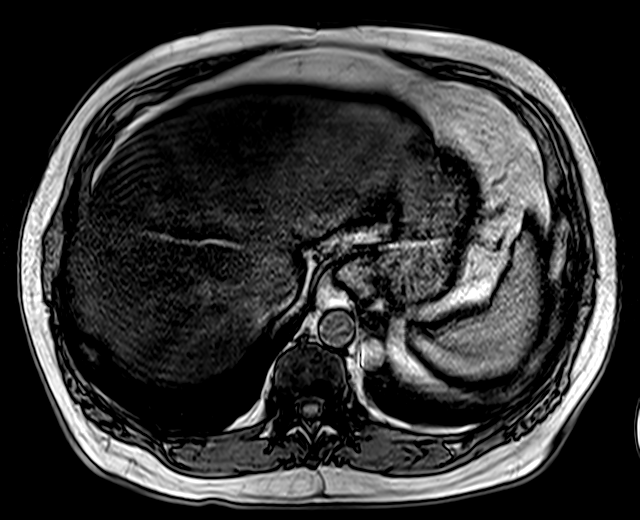
[im 64/64]
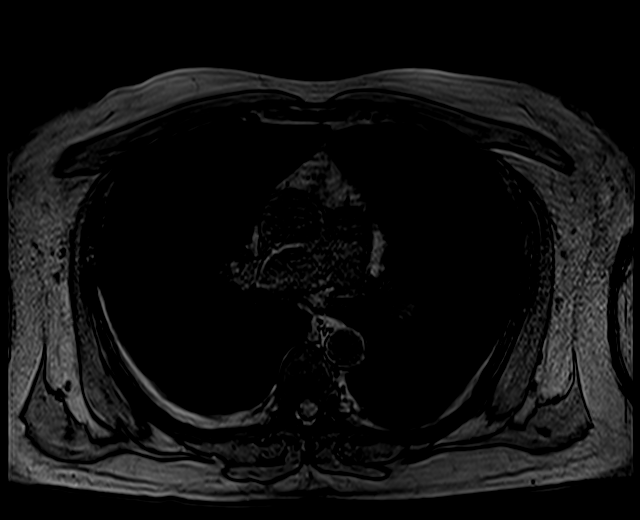

[Series 12: pre test · axial · non-contrast · 3.5mm · 0.62mm/px · z∈[-158,+90]mm · 3 of 72 slices shown]
[im 1/72]
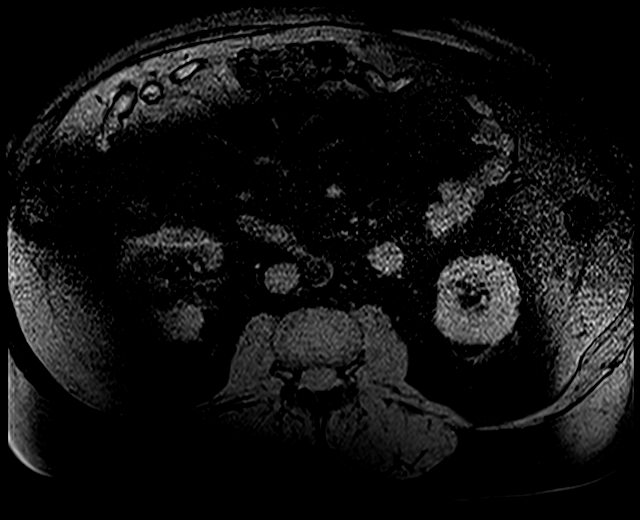
[im 36/72]
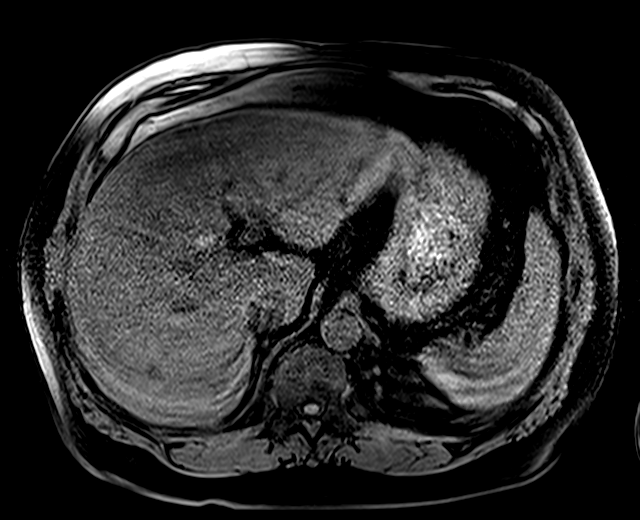
[im 72/72]
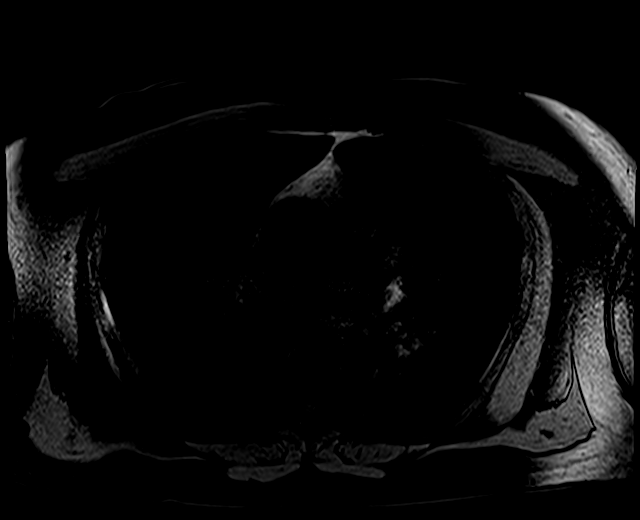

[Series 24: t1fs coronal post · coronal · 2.5mm · 1.42mm/px · 4 of 112 slices shown]
[im 1/112]
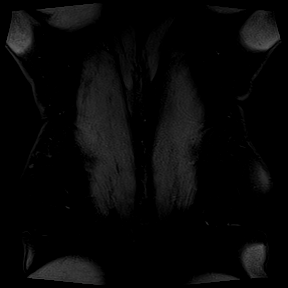
[im 38/112]
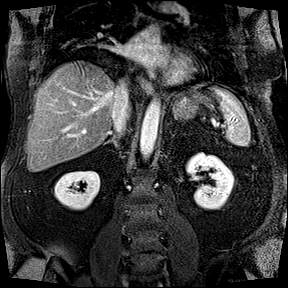
[im 75/112]
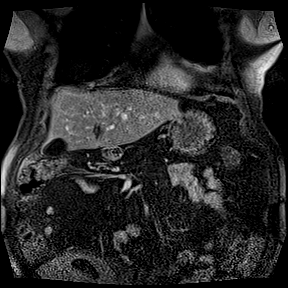
[im 112/112]
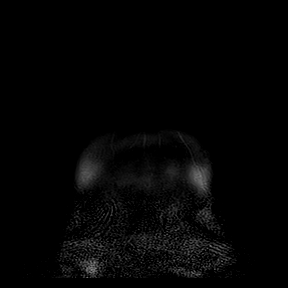

[Series 25: T2 · axial · 5.0mm · 1.56mm/px · z∈[-151,+83]mm · 2 of 40 slices shown (2 of 2)]
[im 1/40]
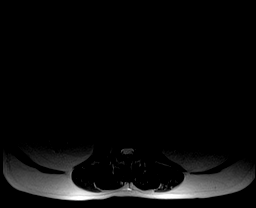
[im 40/40]
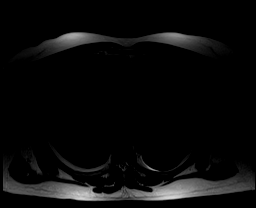

[24 of 48 positions shown; findings below may reference images not displayed]

FINDINGS: Significantly motion degraded scan, limiting assessment.

Lower chest: No acute abnormality at the lung bases.

Hepatobiliary: Normal liver size. No definite liver surface
irregularity. Moderate diffuse hepatic steatosis. There are numerous
(greater than 10) benign-appearing liver cysts scattered throughout
liver, largest 2.9 cm at the left liver dome, not appreciably
changed. Multiple subcentimeter arterial phase foci of enhancement
scattered in the liver are all stable since 06/17/2017 MRI and are
occult on the precontrast sequences, most compatible with benign
transient hepatic intensity differences. No new liver lesions.
Normal gallbladder with no cholelithiasis. No biliary ductal
dilatation. Common bile duct diameter 4 mm. No choledocholithiasis.

Pancreas: No pancreatic mass or duct dilation.  No pancreas divisum.

Spleen: Normal size. No mass.

Adrenals/Urinary Tract: Normal adrenals. No hydronephrosis. Simple
1.5 cm posterior upper right renal cyst. No suspicious renal masses.

Stomach/Bowel: Normal non-distended stomach. Visualized small and
large bowel is normal caliber, with no bowel wall thickening.

Vascular/Lymphatic: Normal caliber abdominal aorta. Patent portal,
splenic, hepatic and renal veins. No pathologically enlarged lymph
nodes in the abdomen.

Other: No abdominal ascites or focal fluid collection.

Musculoskeletal: Several fat containing T2 hyperintense lesions
throughout the thoracolumbar vertebral bodies are all stable, most
compatible with vertebral hemangiomas.
IMPRESSION: 1. No overt morphologic changes of cirrhosis. Moderate diffuse
hepatic steatosis. No secondary findings of portal hypertension.
2. No new or suspicious liver masses. Scattered subcentimeter foci
of arterial hyperenhancement, all stable, probably benign transient
hepatic intensity differences.

## 2020-05-22 DEATH — deceased
